# Patient Record
Sex: Female | Born: 2005 | Race: Black or African American | Hispanic: No | Marital: Single | Smoking: Never smoker
Health system: Southern US, Community
[De-identification: ages and names within clinical notes are randomized; demographics above are authoritative.]

## PROBLEM LIST (undated history)

## (undated) DIAGNOSIS — I10 Essential (primary) hypertension: Secondary | ICD-10-CM

## (undated) HISTORY — DX: Essential (primary) hypertension: I10

## (undated) HISTORY — PX: UMBILICAL HERNIA REPAIR: SHX196

---
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
## 2024-04-10 ENCOUNTER — Ambulatory Visit
Admission: EM | Admit: 2024-04-10 | Discharge: 2024-04-10 | Disposition: A | Discharge status: Home / Self Care | Attending: Family Medicine | Admitting: Family Medicine

## 2024-04-10 DIAGNOSIS — L02414 Cutaneous abscess of left upper limb: Secondary | ICD-10-CM | POA: Diagnosis not present

## 2024-04-10 MED ORDER — MUPIROCIN 2 % EX OINT: 1.0000 | TOPICAL_OINTMENT | Freq: Two times a day (BID) | CUTANEOUS | 0 refills | Status: AC

## 2024-04-10 MED ORDER — SULFAMETHOXAZOLE-TRIMETHOPRIM 800-160 MG PO TABS: 1.0000 | ORAL_TABLET | Freq: Two times a day (BID) | ORAL | 0 refills | Status: AC

## 2024-04-10 NOTE — ED Provider Notes (Addendum)
 [facility]    CSN: [id number] Arrival date & time: 04/10/24  0800      History   Chief Complaint Chief Complaint  Patient presents with   Wound Check    HPI Kim Daniels is a 18 y.o. female presents for wound.  Patient reports 1 week of a worsening wound to her right forearm/elbow.  States she works in a [employer], [employer] and does a lot of leaning against her arms/elbows while performing her job function.  She states a week ago she noticed a small pimple to her left elbow/forearm.  No injury.  States her mom popped it and had some drainage but since then has gotten more swollen and tender.  Does report some bloody drainage.  Reports some chills but no documented fevers.  She is concerned for lead exposure given her job function but states they are checked monthly for lead and she will be checked in the next couple of weeks.  No history of MRSA.  Patient states she is up-to-date on her tetanus.  Has been doing Bactine topically to the area.  No other concerns at this time.   Wound Check    Past Medical History:  Diagnosis Date   Hypertension     Patient Active Problem List   Diagnosis Date Noted   Goiter 02/15/2017   Thyroiditis, autoimmune 02/15/2017   Noncompliance of patient with dietary regimen 02/15/2017   Essential hypertension, benign 12/14/2016   Dyspepsia 12/14/2016   Impaired fasting glucose 04/30/2015   Acanthosis 04/30/2015   Prediabetes 04/30/2015   Obesity 04/30/2015    Past Surgical History:  Procedure Laterality Date   UMBILICAL HERNIA REPAIR     age 37 years    OB History   No obstetric history on file.      Home Medications    Prior to Admission medications   Medication Sig Start Date End Date Taking? Authorizing Provider  mupirocin ointment (BACTROBAN) 2 % Apply 1 Application topically 2 (two) times daily for 7 days. 04/10/24 04/17/24 Yes Nahjae Hoeg, Jodi R, NP  sulfamethoxazole-trimethoprim (BACTRIM DS) 800-160 MG tablet Take 1  tablet by mouth 2 (two) times daily for 7 days. 04/10/24 04/17/24 Yes Amilio Zehnder, Jodi R, NP  acetaminophen  (TYLENOL  8 HOUR) 650 MG CR tablet Take 1 tablet (650 mg total) by mouth every 8 (eight) hours as needed for pain. 12/08/21   Aberman, Caroline C, PA-C  benzonatate  (TESSALON ) 100 MG capsule Take 1 capsule (100 mg total) by mouth every 8 (eight) hours. 12/08/21   Aberman, Caroline C, PA-C  fluticasone (FLONASE) 50 MCG/ACT nasal spray INHALE 1 SPRAY TO EACH NOSTRIL EVERY DAY FOR ALLERGIES 10/12/18   [provider]  lidocaine  (XYLOCAINE ) 2 % solution Use as directed 15 mLs in the mouth or throat as needed for mouth pain. 12/08/21   Aberman, Caroline C, PA-C  loratadine (CLARITIN) 10 MG tablet TAKE 1 TABLET (10 MG) BY ORAL ROUTE ONCE DAILY FOR ALLERGIES 10/12/18   [provider]  naproxen  (NAPROSYN ) 500 MG tablet Take 1 tablet (500 mg total) by mouth 2 (two) times daily. 07/04/21   Geiple, Joshua, PA-C  omeprazole  (PRILOSEC) 20 MG capsule Take one capsule twice daily; 12/18/18 12/19/19  Brennan, Michael J, MD  PATADAY 0.2 % SOLN INSTILL 1 DROP INTO AFFECTED EYE(S) BY OPHTHALMIC ROUTE ONCE DAILY FOR ALLERGIES 10/12/18   [provider]    Family History Family History  Problem Relation Age of Onset   Hypertension Mother    Asthma  Mother    Hypertension Maternal Grandmother    Hypertension Paternal Grandmother     Social History Social History   Tobacco Use   Smoking status: Never   Smokeless tobacco: Never  Vaping Use   Vaping status: Never Used  Substance Use Topics   Alcohol use: Never   Drug use: Never     Allergies   Pollen extract   Review of Systems Review of Systems  Skin:  Positive for wound.     Physical Exam Triage Vital Signs ED Triage Vitals  Encounter Vitals Group     BP 04/10/24 0815 116/72     Systolic BP Percentile --      Diastolic BP Percentile --      Pulse Rate 04/10/24 0815 (!) 107     Resp 04/10/24 0815 16     Temp 04/10/24 0815  99.1 F (37.3 C)     Temp Source 04/10/24 0815 Oral     SpO2 04/10/24 0815 98 %     Weight --      Height --      Head Circumference --      Peak Flow --      Pain Score 04/10/24 0817 8     Pain Loc --      Pain Education --      Exclude from Growth Chart --    No data found.  Updated Vital Signs BP 116/72 (BP Location: Left Arm)   Pulse (!) 107   Temp 99.1 F (37.3 C) (Oral)   Resp 16   LMP 03/18/2024 (Exact Date)   SpO2 98%   Visual Acuity Right Eye Distance:   Left Eye Distance:   Bilateral Distance:    Right Eye Near:   Left Eye Near:    Bilateral Near:     Physical Exam Vitals and nursing note reviewed.  Constitutional:      Appearance: Normal appearance.  HENT:     Head: Normocephalic and atraumatic.  Eyes:     Pupils: Pupils are equal, round, and reactive to light.  Cardiovascular:     Rate and Rhythm: Normal rate.  Pulmonary:     Effort: Pulmonary effort is normal.  Skin:    General: Skin is warm and dry.          Comments: There is a 1 x 2 indurated nonfluctuant abscess to the proximal forearm just adjacent to the elbow.  It is tender to palpation.  No active drainage.  No surrounding erythema or warmth.  Neurological:     General: No focal deficit present.     Mental Status: She is alert and oriented to person, place, and time.  Psychiatric:        Mood and Affect: Mood normal.        Behavior: Behavior normal.      UC Treatments / Results  Labs (all labs ordered are listed, but only abnormal results are displayed) Labs Reviewed - No data to display  EKG   Radiology No results found.  Procedures Procedures (including critical care time)  Medications Ordered in UC Medications - No data to display  Initial Impression / Assessment and Plan / UC Course  I have reviewed the triage vital signs and the nursing notes.  Pertinent labs & imaging results that were available during my care of the patient were reviewed by me and considered  in my medical decision making (see chart for details).  Clinical Course as of 04/10/24 [phone]  Tue Apr 10, 2024  1478 Heart rate recheck 92 [JM]    Clinical Course User Index [JM] Alleen Arbour, NP    Reviewed exam and symptoms with patient.  No red flags.  Will start Bactrim and topical mupirocin.  Discussed wound care.  Advised not to try to drain or pop the area herself.  She is up-to-date on her tetanus.  Her job does do monthly blood checks and she will be having this done in a couple of weeks.  Discussed ER precautions.  PCP follow-up if symptoms do not improve. Final Clinical Impressions(s) / UC Diagnoses   Final diagnoses:  Abscess of left elbow     Discharge Instructions      Keep area clean and dry.  Start Bactrim twice daily for 7 days.  May apply mupirocin topical antibiotic ointment twice daily as well.  Warm compresses to the area.  Do not try to squeeze for open the abscess as this will just for the infection.  Monitor your symptoms closely and if you develop any worsening symptoms please seek reevaluation in the emergency room.  Please follow-up with your PCP if your symptoms do not improve.  Hope you feel better soon!  ED Prescriptions     Medication Sig Dispense Auth. Provider   sulfamethoxazole-trimethoprim (BACTRIM DS) 800-160 MG tablet Take 1 tablet by mouth 2 (two) times daily for 7 days. 14 tablet Izel Hochberg, Jodi R, NP   mupirocin ointment (BACTROBAN) 2 % Apply 1 Application topically 2 (two) times daily for 7 days. 22 g Freeman Borba, Jodi R, NP      PDMP not reviewed this encounter.   Alleen Arbour, NP 04/10/24 2956    Alleen Arbour, NP 04/10/24 [phone]

## 2024-04-10 NOTE — ED Notes (Signed)
 Pt present with a bump and burn near the elbow and on the forearm. Pt states she is concerned of an infection that a machine at work has caused. Pt works in a [employer].

## 2024-04-10 NOTE — Discharge Instructions (Addendum)
 Keep area clean and dry.  Start Bactrim twice daily for 7 days.  May apply mupirocin topical antibiotic ointment twice daily as well.  Warm compresses to the area.  Do not try to squeeze for open the abscess as this will just for the infection.  Monitor your symptoms closely and if you develop any worsening symptoms please seek reevaluation in the emergency room.  Please follow-up with your PCP if your symptoms do not improve.  Hope you feel better soon!
